# Patient Record
Sex: Male | Born: 1975 | Race: Black or African American | Hispanic: No | Marital: Married | Smoking: Current every day smoker
Health system: Southern US, Community
[De-identification: ages and names within clinical notes are randomized; demographics above are authoritative.]

---

## 2000-10-11 ENCOUNTER — Encounter: Payer: Self-pay | Admitting: Emergency Medicine

## 2000-10-11 ENCOUNTER — Emergency Department (HOSPITAL_COMMUNITY): Admission: EM | Admit: 2000-10-11 | Discharge: 2000-10-11 | Payer: Self-pay

## 2000-12-15 ENCOUNTER — Emergency Department (HOSPITAL_COMMUNITY): Admission: EM | Admit: 2000-12-15 | Discharge: 2000-12-15 | Payer: Self-pay | Admitting: *Deleted

## 2009-07-07 ENCOUNTER — Emergency Department (HOSPITAL_COMMUNITY): Admission: EM | Admit: 2009-07-07 | Discharge: 2009-07-07 | Payer: Self-pay | Admitting: Emergency Medicine

## 2017-04-27 ENCOUNTER — Ambulatory Visit (INDEPENDENT_AMBULATORY_CARE_PROVIDER_SITE_OTHER): Payer: 59

## 2017-04-27 ENCOUNTER — Encounter (HOSPITAL_COMMUNITY): Payer: Self-pay | Admitting: *Deleted

## 2017-04-27 ENCOUNTER — Ambulatory Visit (HOSPITAL_COMMUNITY)
Admission: EM | Admit: 2017-04-27 | Discharge: 2017-04-27 | Disposition: A | Discharge status: Home / Self Care | Payer: 59 | Attending: Family Medicine | Admitting: Family Medicine

## 2017-04-27 DIAGNOSIS — M25562 Pain in left knee: Secondary | ICD-10-CM

## 2017-04-27 MED ORDER — ACETAMINOPHEN-CODEINE #3 300-30 MG PO TABS: 1.0000 | ORAL_TABLET | Freq: Four times a day (QID) | ORAL | 0 refills | Status: AC | PRN

## 2017-04-27 MED ORDER — NAPROXEN 500 MG PO TABS: 500.0000 mg | ORAL_TABLET | Freq: Two times a day (BID) | ORAL | 0 refills | Status: AC

## 2017-04-27 NOTE — ED Triage Notes (Signed)
Reports feeling "pop" in left knee yesterday when getting out of car.  C/O continued pain and swelling.

## 2017-04-27 NOTE — Discharge Instructions (Signed)
Ice/cold pack over area for 10-15 min every 2-3 hours while awake.  Activity as tolerated.  Only use Tylenol 3 for breakthrough pain. Do not drive or do anything that requires mental capacity while on it.

## 2017-04-27 NOTE — ED Provider Notes (Signed)
[facility]    CSN: [id number] Arrival date & time: 04/27/17  1934     History   Chief Complaint Chief Complaint  Patient presents with  . Knee Pain    HPI Alex Glenn is a 41 y.o. male.   HPI Yesterday, the patient stepped out of his car and felt a pop in his left knee. There was no strange shift in weight or any trauma. He feels his knee is a little bit swollen. The pain is on the inside of his left knee. He is having difficulty ambulating secondary to pain. He is also having decreased range of motion which he believes is due to swelling. He has not been trying anything at home. No numbness, tingling, bruising, or weakness.  History reviewed. No pertinent past medical history.  History reviewed. No pertinent surgical history.   Home Medications    Family History No family history on file.  Social History Social History  Substance Use Topics  . Smoking status: Current Every Day Smoker    Types: Cigarettes  . Alcohol use Yes     Comment: occasional     Allergies   Patient has no known allergies.   Review of Systems Review of Systems  Musculoskeletal:       +L knee pain  Neurological: Negative for numbness.     Physical Exam Triage Vital Signs ED Triage Vitals  Enc Vitals Group     BP 04/27/17 2011 (!) 162/105     Pulse Rate 04/27/17 2011 72     Resp 04/27/17 2011 16     Temp 04/27/17 2011 98.5 F (36.9 C)     Temp Source 04/27/17 2011 Oral     SpO2 04/27/17 2011 98 %     Pain Score 04/27/17 2012 9   Updated Vital Signs BP (!) 162/105   Pulse 72   Temp 98.5 F (36.9 C) (Oral)   Resp 16   SpO2 98%   Physical Exam  Constitutional: He is oriented to person, place, and time. He appears well-developed and well-nourished.  Pulmonary/Chest: Effort normal.  Musculoskeletal:  No deformity, tender to palpation over the medial joint line and medial femoral condyle, no lateral joint line tenderness, negative Lachman's, patellar  apprehension, patellar grind, varus/valgus stress, McMurray's, mild effusion  Neurological: He is alert and oriented to person, place, and time. No sensory deficit. He exhibits normal muscle tone.  Skin: Skin is warm and dry.  Psychiatric: He has a normal mood and affect. Judgment normal.     UC Treatments / Results  Radiology Dg Knee Complete 4 Views Left  Result Date: 04/27/2017 CLINICAL DATA:  Reports feeling "pop" in left knee yesterday when getting out of car. Pain focused on medial aspect of knee. Continued pain and swelling. Pt states that there is no pain unless the affected area is touched or there is movement of the knee. When bending and moving knee pain radiates to lateral side. Mild discoloration to medial aspect of knee. No previous injury to knee. Nondiabetic. EXAM: LEFT KNEE - COMPLETE 4+ VIEW COMPARISON:  None. FINDINGS: No fracture.  No bone lesion. Knee joint is normally spaced and aligned.  No arthropathic change. There is a small to moderate joint effusion. Soft tissues are unremarkable. IMPRESSION: 1. No fracture, bone lesion or arthropathic change. 2. Small to moderate joint effusion. Electronically Signed   By: Amie Portland M.D.   On: 04/27/2017 21:15    Procedures Procedures none  Initial Impression /  Assessment and Plan / UC Course  I have reviewed the triage vital signs and the nursing notes.  Pertinent labs & imaging results that were available during my care of the patient were reviewed by me and considered in my medical decision making (see chart for details).     Patient presented with a nontraumatic knee injury. Based on his exam, I am most concerned for an MCL/medial meniscus involvement. I would like him to follow-up with a PCP for further evaluation/referral to orthopedic surgery. Ice, activity as tolerated. Offered crutches, patient refused at this time. Recommended scheduled naproxen, Tylenol 3 for breakthrough pain. The patient voiced understanding and  agreement with the plan.  Final Clinical Impressions(s) / UC Diagnoses   Final diagnoses:  Acute pain of left knee    New Prescriptions New Prescriptions   ACETAMINOPHEN-CODEINE (TYLENOL #3) 300-30 MG TABLET    Take 1-2 tablets by mouth every 6 (six) hours as needed for moderate pain.   NAPROXEN (NAPROSYN) 500 MG TABLET    Take 1 tablet (500 mg total) by mouth 2 (two) times daily.     Jilda Roche , [place] 04/27/17 2135

## 2017-12-15 ENCOUNTER — Other Ambulatory Visit: Payer: Self-pay

## 2017-12-15 ENCOUNTER — Encounter (HOSPITAL_COMMUNITY): Payer: Self-pay | Admitting: *Deleted

## 2017-12-15 ENCOUNTER — Ambulatory Visit (HOSPITAL_COMMUNITY)
Admission: EM | Admit: 2017-12-15 | Discharge: 2017-12-15 | Disposition: A | Discharge status: Home / Self Care | Payer: 59 | Attending: Internal Medicine | Admitting: Internal Medicine

## 2017-12-15 DIAGNOSIS — B9789 Other viral agents as the cause of diseases classified elsewhere: Secondary | ICD-10-CM

## 2017-12-15 DIAGNOSIS — J069 Acute upper respiratory infection, unspecified: Secondary | ICD-10-CM

## 2017-12-15 MED ORDER — HYDROCODONE-HOMATROPINE 5-1.5 MG/5ML PO SYRP: 5.0000 mL | ORAL_SOLUTION | Freq: Four times a day (QID) | ORAL | 0 refills | Status: AC | PRN

## 2017-12-15 NOTE — ED Triage Notes (Signed)
C/O cough, head congestion without fevers x approx 3 days.

## 2017-12-15 NOTE — ED Provider Notes (Signed)
[facility]    CSN: [id number] Arrival date & time: 12/15/17  1754     History   Chief Complaint Chief Complaint  Patient presents with  . Nasal Congestion  . Cough    HPI Alex Glenn is a 41 y.o. male.   41 year old male, with no significant past medical history, presenting today complaining of cold symptoms.  Patient states that he has had nasal congestion, postnasal drip, mild sore throat and cough for the past 2 days.  States that he has been using over-the-counter medications at home and is actually feeling better.  States that his main complaint is inability to sleep secondary to cough.  He denies any fever or chills.  States that he has been around his niece recently and she has had similar symptoms.   The history is provided by the patient.  Cough  Cough characteristics:  Non-productive Sputum characteristics:  Nondescript Severity:  Moderate Onset quality:  Gradual Duration:  2 days Timing:  Intermittent Progression:  Unchanged Chronicity:  New Smoker: no   Context: sick contacts   Context: not animal exposure, not exposure to allergens and not fumes   Relieved by:  Decongestant and cough suppressants Worsened by:  Nothing Associated symptoms: sinus congestion and sore throat   Associated symptoms: no chest pain, no chills, no diaphoresis, no ear fullness, no ear pain, no eye discharge, no fever, no headaches, no myalgias, no rash, no rhinorrhea, no shortness of breath, no weight loss and no wheezing   Risk factors: no chemical exposure, no recent infection and no recent travel     History reviewed. No pertinent past medical history.  There are no active problems to display for this patient.   History reviewed. No pertinent surgical history.     Home Medications    Prior to Admission medications   Medication Sig Start Date End Date Taking? Authorizing Provider  acetaminophen-codeine (TYLENOL #3) 300-30 MG tablet Take 1-2 tablets by  mouth every 6 (six) hours as needed for moderate pain. 04/27/17   Wendling, Jilda RocheNicholas Paul, DO  HYDROcodone-homatropine (HYCODAN) 5-1.5 MG/5ML syrup Take 5 mLs by mouth every 6 (six) hours as needed for cough. 12/15/17   Cherae Marton C, PA-C  naproxen (NAPROSYN) 500 MG tablet Take 1 tablet (500 mg total) by mouth 2 (two) times daily. 04/27/17   Sharlene DoryWendling, Nicholas Paul, DO    Family History No family history on file.  Social History Social History   Tobacco Use  . Smoking status: Current Every Day Smoker    Types: Cigarettes  Substance Use Topics  . Alcohol use: Yes    Comment: occasional  . Drug use: Yes    Types: Marijuana     Allergies   Patient has no known allergies.   Review of Systems Review of Systems  Constitutional: Negative for chills, diaphoresis, fever and weight loss.  HENT: Positive for congestion, postnasal drip and sore throat. Negative for ear pain and rhinorrhea.   Eyes: Negative for pain, discharge and visual disturbance.  Respiratory: Positive for cough. Negative for shortness of breath and wheezing.   Cardiovascular: Negative for chest pain and palpitations.  Gastrointestinal: Negative for abdominal pain and vomiting.  Genitourinary: Negative for dysuria and hematuria.  Musculoskeletal: Negative for arthralgias, back pain and myalgias.  Skin: Negative for color change and rash.  Neurological: Negative for seizures, syncope and headaches.  All other systems reviewed and are negative.    Physical Exam Triage Vital Signs ED Triage Vitals [12/15/17 1811]  Enc Vitals Group     BP (!) 155/100     Pulse Rate 70     Resp 18     Temp 99.1 F (37.3 C)     Temp Source Oral     SpO2 98 %     Weight      Height      Head Circumference      Peak Flow      Pain Score      Pain Loc      Pain Edu?      Excl. in GC?    No data found.  Updated Vital Signs BP (!) 155/100   Pulse 70   Temp 99.1 F (37.3 C) (Oral)   Resp 18   SpO2 98%   Visual  Acuity Right Eye Distance:   Left Eye Distance:   Bilateral Distance:    Right Eye Near:   Left Eye Near:    Bilateral Near:     Physical Exam  Constitutional: He appears well-developed and well-nourished.  HENT:  Head: Normocephalic and atraumatic.  Right Ear: Hearing, tympanic membrane, external ear and ear canal normal.  Left Ear: Hearing, tympanic membrane, external ear and ear canal normal.  Nose: Nose normal.  Mouth/Throat: Uvula is midline and oropharynx is clear and moist. No oropharyngeal exudate, posterior oropharyngeal edema, posterior oropharyngeal erythema or tonsillar abscesses.  Eyes: Conjunctivae are normal.  Neck: Neck supple.  Cardiovascular: Normal rate and regular rhythm.  No murmur heard. Pulmonary/Chest: Effort normal and breath sounds normal. No stridor. No respiratory distress. He has no decreased breath sounds. He has no wheezes. He has no rhonchi. He has no rales.  Abdominal: Soft. There is no tenderness.  Musculoskeletal: He exhibits no edema.  Neurological: He is alert.  Skin: Skin is warm and dry.  Psychiatric: He has a normal mood and affect.  Nursing note and vitals reviewed.    UC Treatments / Results  Labs (all labs ordered are listed, but only abnormal results are displayed) Labs Reviewed - No data to display  EKG  EKG Interpretation None       Radiology No results found.  Procedures Procedures (including critical care time)  Medications Ordered in UC Medications - No data to display   Initial Impression / Assessment and Plan / UC Course  I have reviewed the triage vital signs and the nursing notes.  Pertinent labs & imaging results that were available during my care of the patient were reviewed by me and considered in my medical decision making (see chart for details).     Viral URI - cough suppressant and continued use of OTC meds   Final Clinical Impressions(s) / UC Diagnoses   Final diagnoses:  Viral URI with cough     ED Discharge Orders        Ordered    HYDROcodone-homatropine (HYCODAN) 5-1.5 MG/5ML syrup  Every 6 hours PRN     12/15/17 1824       Controlled Substance Prescriptions [place] Controlled Substance Registry consulted? Yes, I have consulted the [place] Controlled Substances Registry for this patient, and feel the risk/benefit ratio today is favorable for proceeding with this prescription for a controlled substance.   Alecia LemmingBlue, Marieclaire Bettenhausen C, [place]-C 12/15/17 [phone]

## 2018-09-04 IMAGING — DX DG KNEE COMPLETE 4+V*L*
4 series · 4 of 4 positions shown · non-contrast
Comparison: None.

CLINICAL DATA: Reports feeling "pop" in left knee yesterday when
getting out of car. Pain focused on medial aspect of knee. Continued
pain and swelling. Pt states that there is no pain unless the
affected area is touched or there is movement of the knee. When
bending and moving knee pain radiates to lateral side. Mild
discoloration to medial aspect of knee. No previous injury to knee.
Nondiabetic.

EXAM:
LEFT KNEE - COMPLETE 4+ VIEW

[knee ap]
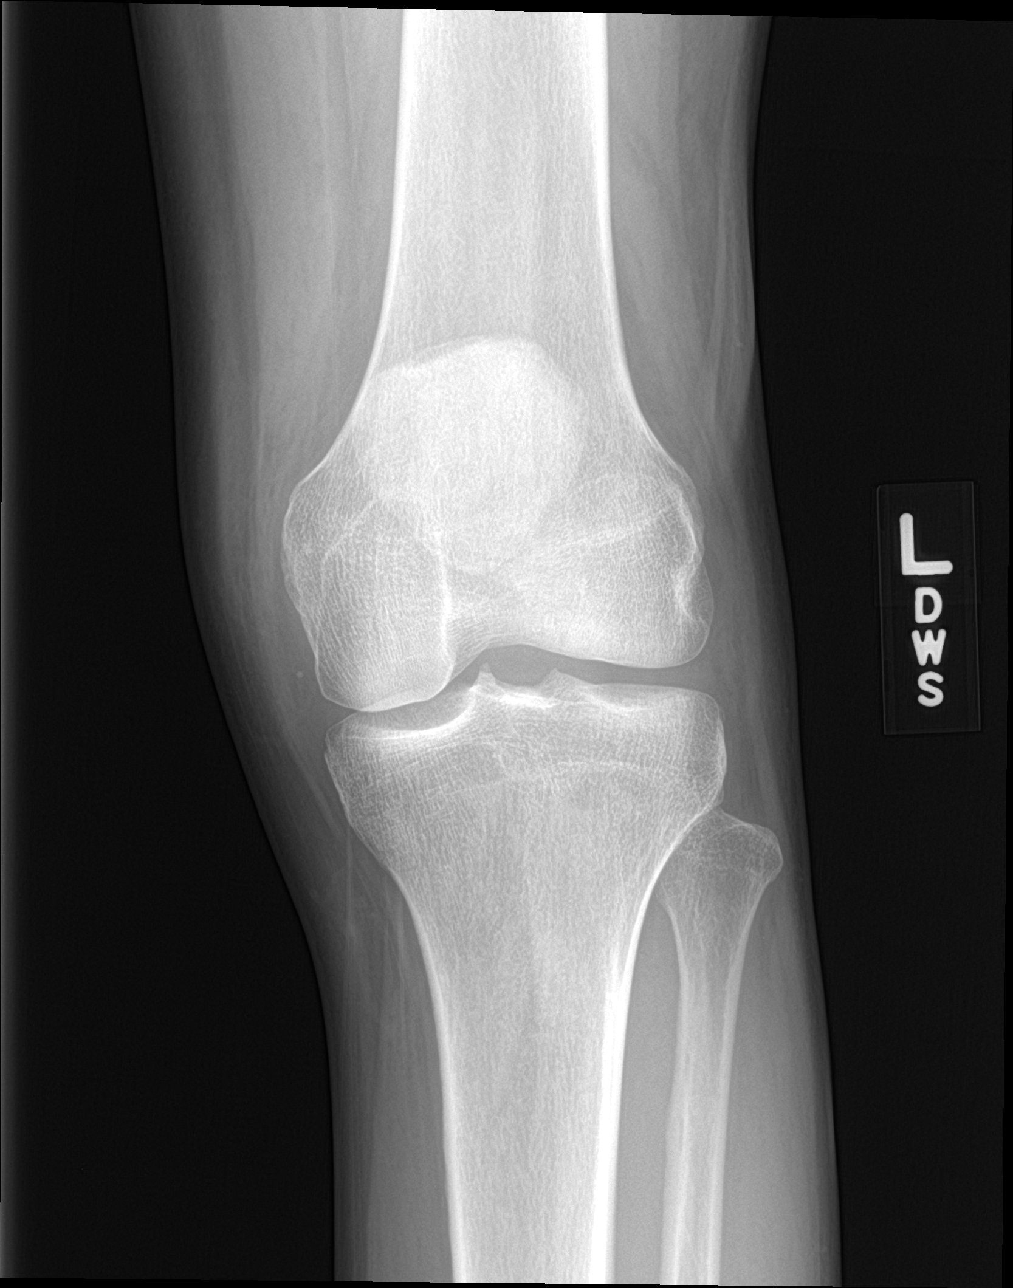

[knee obl (1 of 2)]
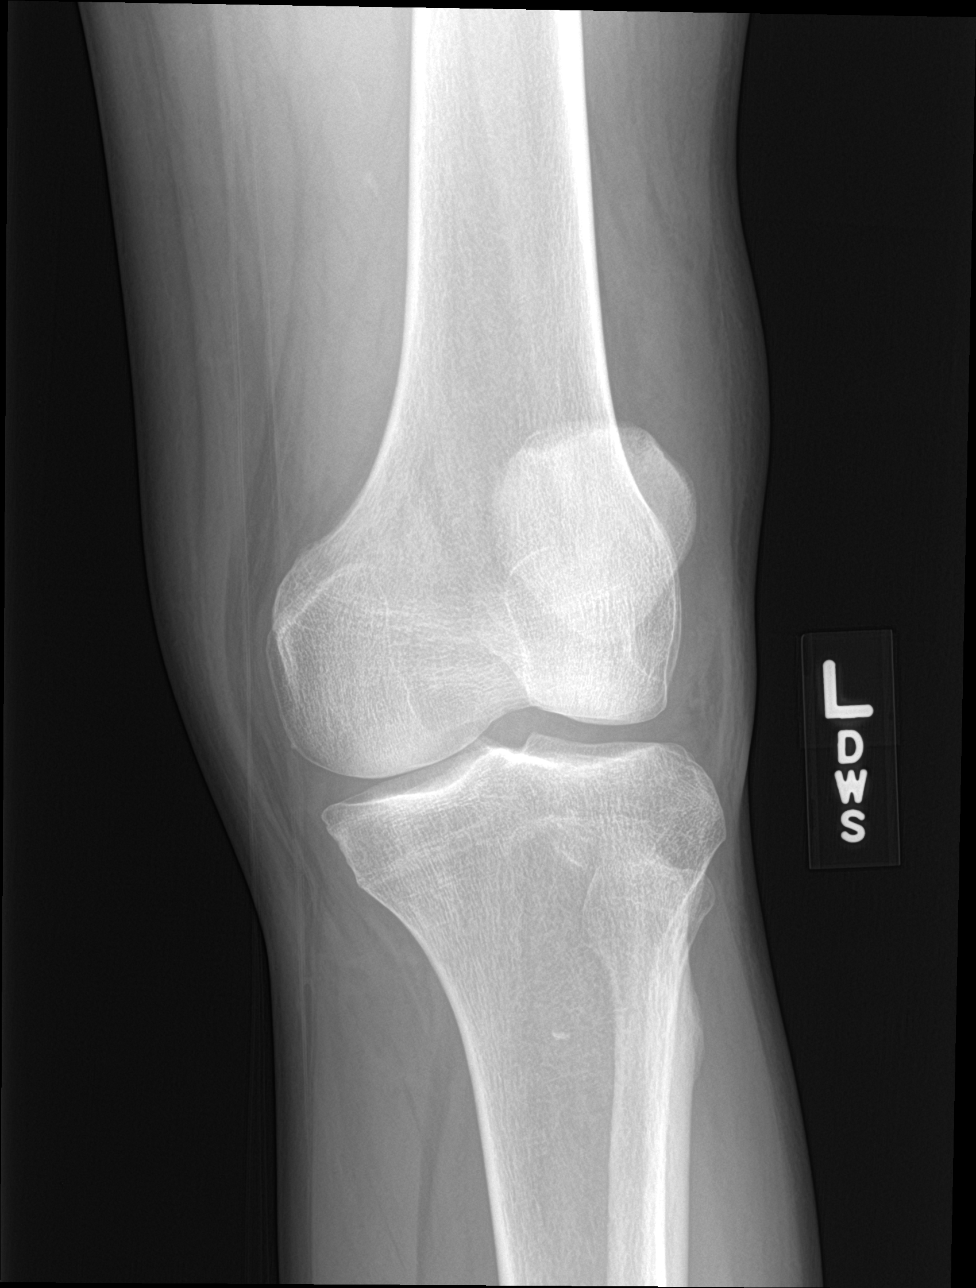

[knee obl (2 of 2)]
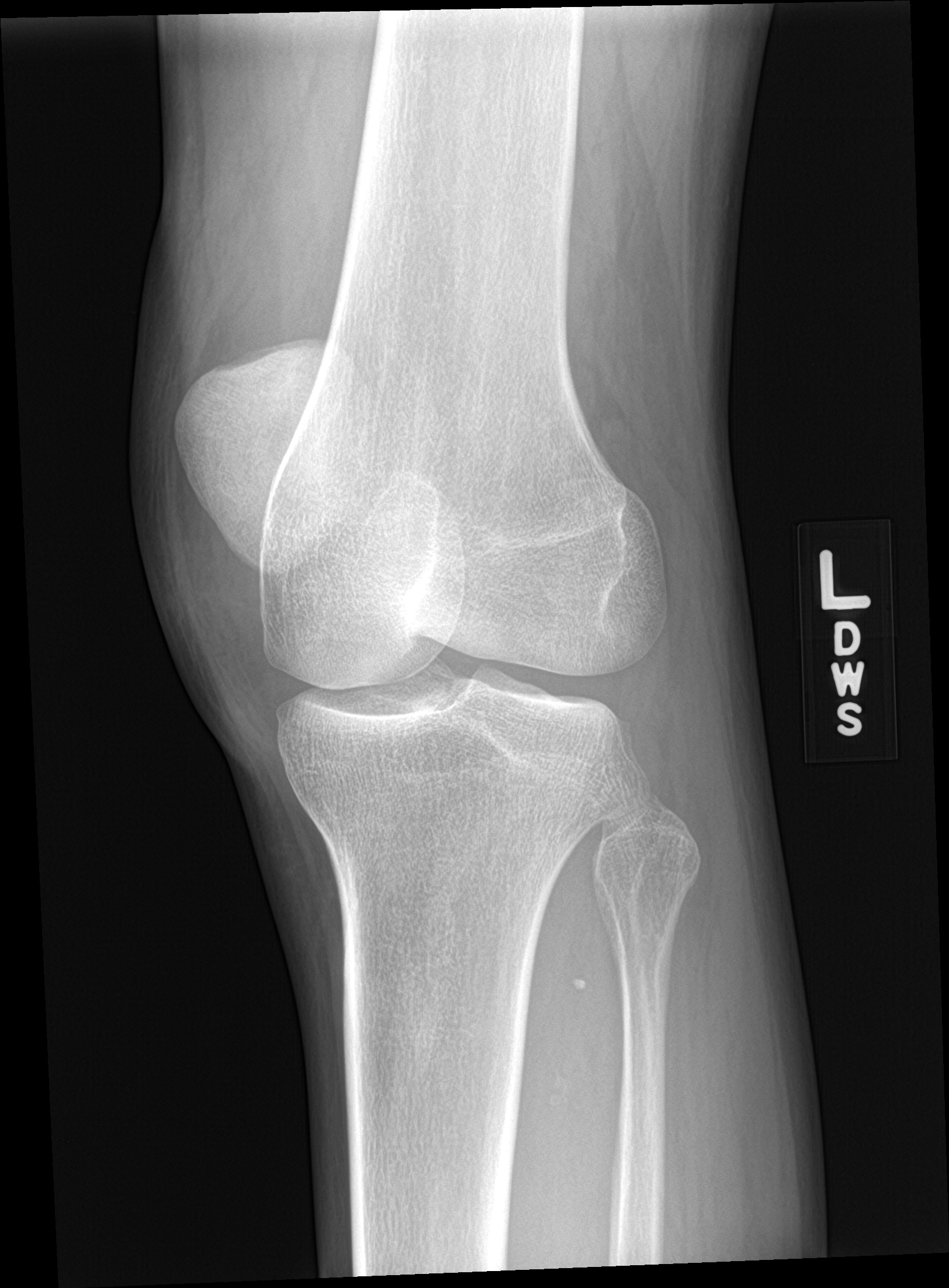

[knee lat]
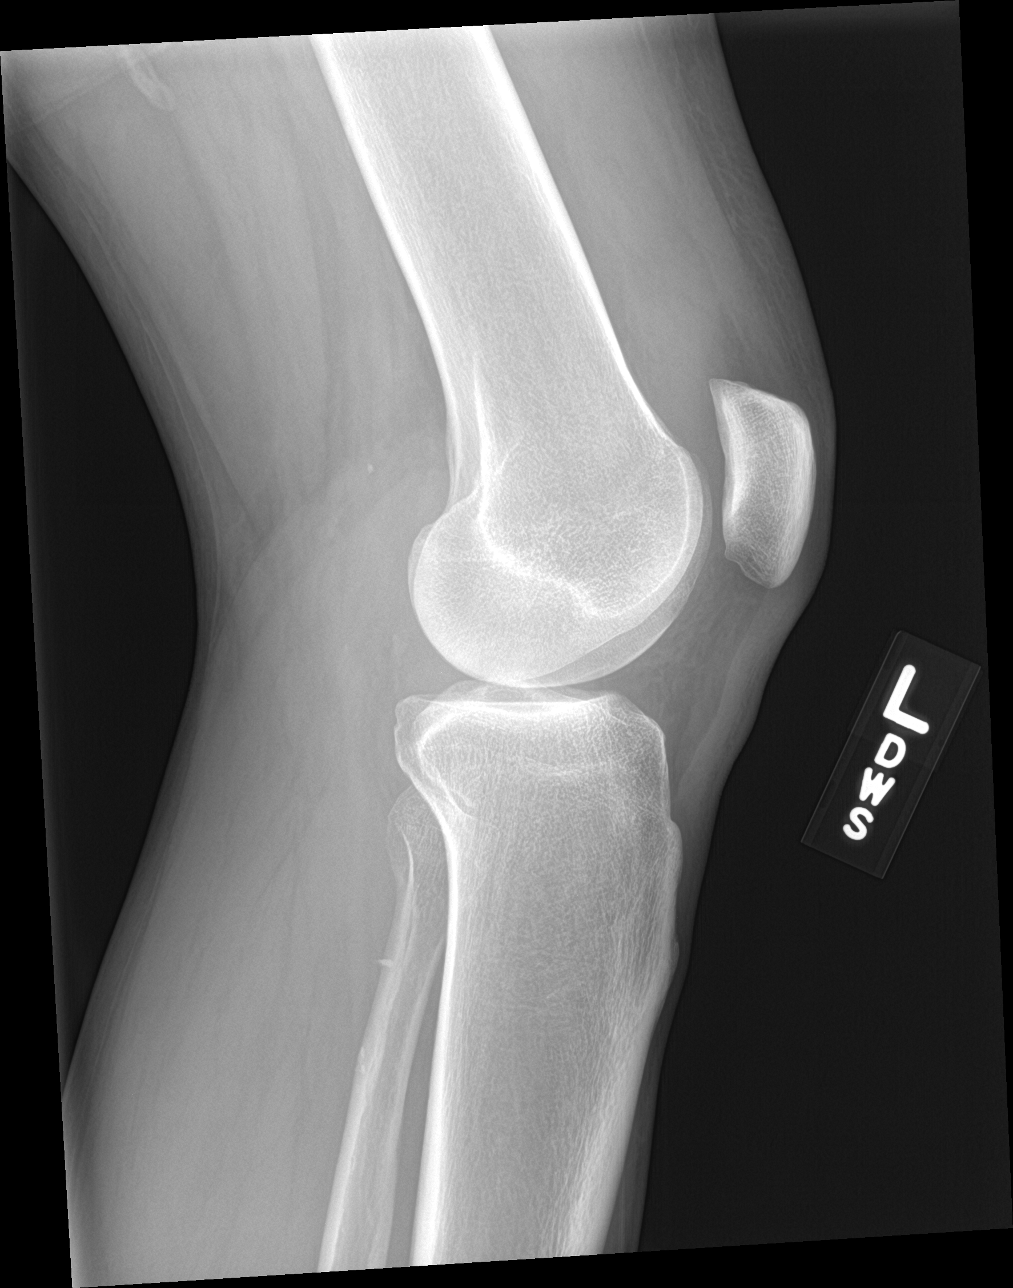

[4 of 4 positions shown; findings below may reference images not displayed]

FINDINGS: No fracture.  No bone lesion.

Knee joint is normally spaced and aligned.  No arthropathic change.

There is a small to moderate joint effusion.

Soft tissues are unremarkable.
IMPRESSION: 1. No fracture, bone lesion or arthropathic change.
2. Small to moderate joint effusion.
